# Patient Record
Sex: Male | Born: 1965 | Race: White | Hispanic: No | Marital: Married | Smoking: Never smoker
Health system: Southern US, Community
[De-identification: ages and names within clinical notes are randomized; demographics above are authoritative.]

## PROBLEM LIST (undated history)

## (undated) DIAGNOSIS — J189 Pneumonia, unspecified organism: Secondary | ICD-10-CM

## (undated) DIAGNOSIS — N200 Calculus of kidney: Secondary | ICD-10-CM

## (undated) DIAGNOSIS — M109 Gout, unspecified: Secondary | ICD-10-CM

## (undated) DIAGNOSIS — G510 Bell's palsy: Secondary | ICD-10-CM

## (undated) DIAGNOSIS — I10 Essential (primary) hypertension: Secondary | ICD-10-CM

## (undated) HISTORY — PX: LITHOTRIPSY: SUR834

## (undated) HISTORY — DX: Essential (primary) hypertension: I10

---

## 2001-06-25 ENCOUNTER — Ambulatory Visit (HOSPITAL_COMMUNITY): Admission: RE | Admit: 2001-06-25 | Discharge: 2001-06-25 | Payer: Self-pay | Admitting: Family Medicine

## 2001-06-25 ENCOUNTER — Encounter: Payer: Self-pay | Admitting: Family Medicine

## 2004-06-16 ENCOUNTER — Emergency Department (HOSPITAL_COMMUNITY): Admission: EM | Admit: 2004-06-16 | Discharge: 2004-06-16 | Payer: Self-pay | Admitting: Emergency Medicine

## 2004-06-20 ENCOUNTER — Ambulatory Visit (HOSPITAL_COMMUNITY): Admission: RE | Admit: 2004-06-20 | Discharge: 2004-06-20 | Payer: Self-pay | Admitting: Urology

## 2008-05-08 ENCOUNTER — Emergency Department (HOSPITAL_COMMUNITY): Admission: EM | Admit: 2008-05-08 | Discharge: 2008-05-08 | Payer: Self-pay | Admitting: Emergency Medicine

## 2011-04-01 LAB — URINALYSIS, ROUTINE W REFLEX MICROSCOPIC
Bilirubin Urine: NEGATIVE
Glucose, UA: NEGATIVE
Ketones, ur: NEGATIVE
Leukocytes, UA: NEGATIVE
Urobilinogen, UA: 0.2

## 2011-04-01 LAB — BASIC METABOLIC PANEL
Chloride: 110
Glucose, Bld: 108 — ABNORMAL HIGH
Potassium: 4.1

## 2011-04-01 LAB — URINE MICROSCOPIC-ADD ON

## 2011-04-01 LAB — CBC
HCT: 45.4
Hemoglobin: 15.6
MCHC: 34.3
MCV: 92.8
Platelets: 286
RDW: 12.9
WBC: 7.7

## 2011-04-01 LAB — DIFFERENTIAL
Basophils Relative: 0
Monocytes Absolute: 0.3
Neutro Abs: 6.6

## 2012-12-12 ENCOUNTER — Encounter (HOSPITAL_COMMUNITY): Payer: Self-pay | Admitting: Emergency Medicine

## 2012-12-12 ENCOUNTER — Emergency Department (HOSPITAL_COMMUNITY)
Admission: EM | Admit: 2012-12-12 | Discharge: 2012-12-12 | Disposition: A | Discharge status: Home / Self Care | Payer: 59 | Attending: Emergency Medicine | Admitting: Emergency Medicine

## 2012-12-12 DIAGNOSIS — G510 Bell's palsy: Secondary | ICD-10-CM | POA: Insufficient documentation

## 2012-12-12 DIAGNOSIS — R439 Unspecified disturbances of smell and taste: Secondary | ICD-10-CM | POA: Insufficient documentation

## 2012-12-12 DIAGNOSIS — Z79899 Other long term (current) drug therapy: Secondary | ICD-10-CM | POA: Insufficient documentation

## 2012-12-12 DIAGNOSIS — Z87442 Personal history of urinary calculi: Secondary | ICD-10-CM | POA: Insufficient documentation

## 2012-12-12 DIAGNOSIS — H04219 Epiphora due to excess lacrimation, unspecified lacrimal gland: Secondary | ICD-10-CM | POA: Insufficient documentation

## 2012-12-12 DIAGNOSIS — M109 Gout, unspecified: Secondary | ICD-10-CM | POA: Insufficient documentation

## 2012-12-12 DIAGNOSIS — R209 Unspecified disturbances of skin sensation: Secondary | ICD-10-CM | POA: Insufficient documentation

## 2012-12-12 DIAGNOSIS — Z7982 Long term (current) use of aspirin: Secondary | ICD-10-CM | POA: Insufficient documentation

## 2012-12-12 HISTORY — DX: Calculus of kidney: N20.0

## 2012-12-12 HISTORY — DX: Gout, unspecified: M10.9

## 2012-12-12 LAB — COMPREHENSIVE METABOLIC PANEL
AST: 30 U/L (ref 0–37)
Albumin: 3.5 g/dL (ref 3.5–5.2)
CO2: 26 mEq/L (ref 19–32)
GFR calc non Af Amer: >90 mL/min (ref >90)

## 2012-12-12 LAB — DIFFERENTIAL
Basophils Absolute: 0 10*3/uL (ref 0.0–0.1)
Lymphocytes Relative: 21 % (ref 12–46)
Monocytes Relative: 12 % (ref 3–12)
Neutro Abs: 4.9 10*3/uL (ref 1.7–7.7)
Neutrophils Relative %: 65 % (ref 43–77)

## 2012-12-12 LAB — CBC
HCT: 43.6 % (ref 39.0–52.0)
Hemoglobin: 14.8 g/dL (ref 13.0–17.0)
MCHC: 33.9 g/dL (ref 30.0–36.0)
MCV: 90.5 fL (ref 78.0–100.0)
RDW: 12.4 % (ref 11.5–15.5)
WBC: 7.5 10*3/uL (ref 4.0–10.5)

## 2012-12-12 LAB — POCT I-STAT, CHEM 8
BUN: 15 mg/dL (ref 6–23)
Calcium, Ion: 1.17 mmol/L (ref 1.12–1.23)
Hemoglobin: 15 g/dL (ref 13.0–17.0)
Sodium: 141 mEq/L (ref 135–145)
TCO2: 27 mmol/L (ref 0–100)

## 2012-12-12 LAB — PROTIME-INR: Prothrombin Time: 12.4 seconds (ref 11.6–15.2)

## 2012-12-12 MED ORDER — VALACYCLOVIR HCL 1 G PO TABS: 1000.0000 mg | ORAL_TABLET | Freq: Three times a day (TID) | ORAL | Status: AC

## 2012-12-12 MED ORDER — PREDNISONE 10 MG PO TABS: 20.0000 mg | ORAL_TABLET | Freq: Every day | ORAL | Status: DC

## 2012-12-12 MED ORDER — REFRESH P.M. OP OINT: TOPICAL_OINTMENT | OPHTHALMIC | Status: DC | PRN

## 2012-12-12 NOTE — ED Notes (Signed)
Per pt he went to lay down to take a nap yesterday at 3pm and woke up couple hours later with left sided facial droop and numbness.  Pt grips are equal and strong.

## 2012-12-12 NOTE — ED Provider Notes (Signed)
History     CSN: [id number]  Arrival date & time 12/12/12  1238   First MD Initiated Contact with Patient 12/12/12 1256      Chief Complaint  Patient presents with  . Facial Droop  . Numbness    (Consider location/radiation/quality/duration/timing/severity/associated sxs/prior treatment) The history is provided by the patient.   patient here complaining of left-sided facial droop x24 hours. Also notes trouble with tasting different foods. He denies any headache or vomiting. No neck pain. No chest pain or abdominal pain. No unilateral weakness. Does note some increased tearing to his left eye. Symptoms have been persistent and nothing makes them better or worse and no treatment used prior to arrival.  Past Medical History  Diagnosis Date  . Kidney stones   . Gout     Past Surgical History  Procedure Laterality Date  . Lithotripsy      No family history on file.  History  Substance Use Topics  . Smoking status: Not on file  . Smokeless tobacco: Never Used  . Alcohol Use: No      Review of Systems  All other systems reviewed and are negative.    Allergies  Review of patient's allergies indicates not on file.  Home Medications  No current outpatient prescriptions on file.  BP 134/78  Pulse 92  Temp(Src) 97.6 F (36.4 C) (Oral)  Resp 18  SpO2 97%  Physical Exam  Nursing note and vitals reviewed. Constitutional: He is oriented to person, place, and time. He appears well-developed and well-nourished.  Non-toxic appearance. No distress.  HENT:  Head: Normocephalic and atraumatic.  Eyes: Conjunctivae, EOM and lids are normal. Pupils are equal, round, and reactive to light.  Neck: Normal range of motion. Neck supple. No tracheal deviation present. No mass present.  Cardiovascular: Normal rate, regular rhythm and normal heart sounds.  Exam reveals no gallop.   No murmur heard. Pulmonary/Chest: Effort normal and breath sounds normal. No stridor. No respiratory  distress. He has no decreased breath sounds. He has no wheezes. He has no rhonchi. He has no rales.  Abdominal: Soft. Normal appearance and bowel sounds are normal. He exhibits no distension. There is no tenderness. There is no rebound and no CVA tenderness.  Musculoskeletal: Normal range of motion. He exhibits no edema and no tenderness.  Neurological: He is alert and oriented to person, place, and time. He displays no tremor. A cranial nerve deficit is present. No sensory deficit. Coordination and gait normal. GCS eye subscore is 4. GCS verbal subscore is 5. GCS motor subscore is 6.  Left sided facial paralysis  Skin: Skin is warm and dry. No abrasion and no rash noted.  Psychiatric: He has a normal mood and affect. His speech is normal and behavior is normal.    ED Course  Procedures (including critical care time)  Labs Reviewed  CBC  DIFFERENTIAL  PROTIME-INR  APTT  COMPREHENSIVE METABOLIC PANEL  TROPONIN I  POCT I-STAT, CHEM 8   No results found.   No diagnosis found.    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

## 2015-05-29 ENCOUNTER — Other Ambulatory Visit (HOSPITAL_COMMUNITY): Payer: Self-pay | Admitting: General Surgery

## 2015-05-31 ENCOUNTER — Ambulatory Visit: Payer: Self-pay | Admitting: Dietician

## 2015-06-14 ENCOUNTER — Ambulatory Visit (HOSPITAL_COMMUNITY): Admission: RE | Admit: 2015-06-14 | Payer: Self-pay | Source: Ambulatory Visit

## 2015-06-14 ENCOUNTER — Ambulatory Visit (HOSPITAL_COMMUNITY): Payer: Self-pay

## 2015-06-27 ENCOUNTER — Other Ambulatory Visit (HOSPITAL_COMMUNITY): Payer: Self-pay | Admitting: Psychiatry

## 2015-07-11 ENCOUNTER — Encounter: Payer: 59 | Attending: General Surgery | Admitting: Dietician

## 2015-07-11 ENCOUNTER — Encounter: Payer: Self-pay | Admitting: Dietician

## 2015-07-11 DIAGNOSIS — Z713 Dietary counseling and surveillance: Secondary | ICD-10-CM | POA: Insufficient documentation

## 2015-07-11 DIAGNOSIS — Z6841 Body Mass Index (BMI) 40.0 and over, adult: Secondary | ICD-10-CM | POA: Insufficient documentation

## 2015-07-11 NOTE — Progress Notes (Signed)
  Pre-Op Assessment Visit:  Pre-Operative Sleeve Gastrectomy Surgery  Medical Nutrition Therapy:  Appt start time: 1025   End time:  1100.  Patient was seen on 07/11/2015 for Pre-Operative Nutrition Assessment. Assessment and letter of approval [facility] coordinator on 07/11/2015.   Preferred Learning Style:   No preference indicated   Learning Readiness:   Ready  Handouts given during visit include:  Pre-Op Goals Bariatric Surgery Protein Shakes   During the appointment today the following Pre-Op Goals were reviewed with the patient: Maintain or lose weight as instructed by your surgeon Make healthy food choices Begin to limit portion sizes Limited concentrated sugars and fried foods Keep fat/sugar in the single digits per serving on   food labels Practice CHEWING your food  (aim for 30 chews per bite or until applesauce consistency) Practice not drinking 15 minutes before, during, and 30 minutes after each meal/snack Avoid all carbonated beverages  Avoid/limit caffeinated beverages  Avoid all sugar-sweetened beverages Consume 3 meals per day; eat every 3-5 hours Make a list of non-food related activities Aim for 64-100 ounces of FLUID daily  Aim for at least 60-80 grams of PROTEIN daily Look for a liquid protein source that contain ?15 g protein and ?5 g carbohydrate  (ex: shakes, drinks, shots)  Demonstrated degree of understanding via:  Teach Back  Teaching Method Utilized:  Visual Auditory Hands on  Barriers to learning/adherence to lifestyle change: none  [facility] to [facility] the [facility] to enroll in Pre-Op and Post-Op Nutrition Education when surgery date is scheduled.

## 2015-07-12 ENCOUNTER — Ambulatory Visit (HOSPITAL_COMMUNITY)
Admission: RE | Admit: 2015-07-12 | Discharge: 2015-07-12 | Disposition: A | Discharge status: Home / Self Care | Payer: 59 | Source: Ambulatory Visit | Attending: General Surgery | Admitting: General Surgery

## 2015-07-12 ENCOUNTER — Other Ambulatory Visit: Payer: Self-pay

## 2015-07-12 DIAGNOSIS — R918 Other nonspecific abnormal finding of lung field: Secondary | ICD-10-CM | POA: Diagnosis not present

## 2015-07-12 DIAGNOSIS — Z01818 Encounter for other preprocedural examination: Secondary | ICD-10-CM | POA: Diagnosis not present

## 2015-11-19 ENCOUNTER — Encounter: Payer: 59 | Attending: General Surgery

## 2015-11-19 DIAGNOSIS — Z6841 Body Mass Index (BMI) 40.0 and over, adult: Secondary | ICD-10-CM | POA: Insufficient documentation

## 2015-11-19 DIAGNOSIS — Z713 Dietary counseling and surveillance: Secondary | ICD-10-CM | POA: Diagnosis not present

## 2015-11-19 NOTE — Progress Notes (Signed)
  Pre-Operative Nutrition Class:  Appt start time: 4734   End time:  1830.  Patient was seen on 11/19/2015 for Pre-Operative Bariatric Surgery Education at the [facility].   Surgery date:  Surgery type: sleeve gastrectomy Start weight at [facility]: 348 lbs on 07/11/2015 Weight today: 346.7 lbs  TANITA  BODY COMP RESULTS  11/19/15   BMI (kg/m^2) N/A   Fat Mass (lbs)    Fat Free Mass (lbs)    Total Body Water (lbs)    Samples given per MNT protocol. Patient educated on appropriate usage: Premier protein shake (vanilla - qty 1) Lot #: [id number] Exp: 08/2016  Celebrate Vitamins Multivitamin (orange - qty 1) Lot #: [id number] Exp: 05/2016  Bariatric Advantage Calcium Citrate chew (tropical orange - qty 1) Lot #: [id number] Exp: 01/2016  Unjury Protein Powder (strawberry - qty 1) Lot #: [id number] Exp: 12/2016  The following the learning objectives were met by the patient during this course:  Identify Pre-Op Dietary Goals and will begin 2 weeks pre-operatively  Identify appropriate sources of fluids and proteins   State protein recommendations and appropriate sources pre and post-operatively  Identify Post-Operative Dietary Goals and will follow for 2 weeks post-operatively  Identify appropriate multivitamin and calcium sources  Describe the need for physical activity post-operatively and will follow MD recommendations  State when to call healthcare provider regarding medication questions or post-operative complications  Handouts given during class include:  Pre-Op Bariatric Surgery Diet Handout  Protein Shake Handout  Post-Op Bariatric Surgery Nutrition Handout  [facility] Program Information Flyer  Support Group Information Flyer  [facility] Bariatric Supplements Price List  Follow-Up Plan: Patient will follow-up at [facility] 2 weeks post operatively for diet advancement per MD.

## 2015-11-29 ENCOUNTER — Other Ambulatory Visit: Payer: Self-pay | Admitting: General Surgery

## 2015-11-30 ENCOUNTER — Encounter (HOSPITAL_COMMUNITY): Payer: Self-pay

## 2015-11-30 ENCOUNTER — Encounter (HOSPITAL_COMMUNITY)
Admission: RE | Admit: 2015-11-30 | Discharge: 2015-11-30 | Disposition: A | Discharge status: Home / Self Care | Payer: 59 | Source: Ambulatory Visit | Attending: General Surgery | Admitting: General Surgery

## 2015-11-30 DIAGNOSIS — I10 Essential (primary) hypertension: Secondary | ICD-10-CM | POA: Diagnosis not present

## 2015-11-30 DIAGNOSIS — Z79891 Long term (current) use of opiate analgesic: Secondary | ICD-10-CM | POA: Diagnosis not present

## 2015-11-30 DIAGNOSIS — Z7982 Long term (current) use of aspirin: Secondary | ICD-10-CM | POA: Diagnosis not present

## 2015-11-30 DIAGNOSIS — Z6841 Body Mass Index (BMI) 40.0 and over, adult: Secondary | ICD-10-CM | POA: Diagnosis not present

## 2015-11-30 DIAGNOSIS — Z79899 Other long term (current) drug therapy: Secondary | ICD-10-CM | POA: Diagnosis not present

## 2015-11-30 HISTORY — DX: Bell's palsy: G51.0

## 2015-11-30 HISTORY — DX: Pneumonia, unspecified organism: J18.9

## 2015-11-30 LAB — COMPREHENSIVE METABOLIC PANEL
ALBUMIN: 4.6 g/dL (ref 3.5–5.0)
ALK PHOS: 69 U/L (ref 38–126)
ALT: 68 U/L — AB (ref 17–63)
ANION GAP: 9 (ref 5–15)
AST: 57 U/L — ABNORMAL HIGH (ref 15–41)
BUN: 29 mg/dL — ABNORMAL HIGH (ref 6–20)
CALCIUM: 9.9 mg/dL (ref 8.9–10.3)
CHLORIDE: 108 mmol/L (ref 101–111)
CO2: 22 mmol/L (ref 22–32)
Creatinine, Ser: 0.93 mg/dL (ref 0.61–1.24)
GFR calc Af Amer: >60 mL/min (ref >60)
GFR calc non Af Amer: >60 mL/min (ref >60)
GLUCOSE: 91 mg/dL (ref 65–99)
Potassium: 4.6 mmol/L (ref 3.5–5.1)
SODIUM: 139 mmol/L (ref 135–145)
Total Bilirubin: 1.1 mg/dL (ref 0.3–1.2)
Total Protein: 8.2 g/dL — ABNORMAL HIGH (ref 6.5–8.1)

## 2015-11-30 LAB — CBC WITH DIFFERENTIAL/PLATELET
BASOS PCT: 1 %
Basophils Absolute: 0.1 10*3/uL (ref 0.0–0.1)
EOS ABS: 0.1 10*3/uL (ref 0.0–0.7)
EOS PCT: 2 %
HCT: 43.4 % (ref 39.0–52.0)
HEMOGLOBIN: 15.1 g/dL (ref 13.0–17.0)
Lymphocytes Relative: 28 %
Lymphs Abs: 1.8 10*3/uL (ref 0.7–4.0)
MCH: 32.1 pg (ref 26.0–34.0)
MCHC: 34.8 g/dL (ref 30.0–36.0)
MCV: 92.3 fL (ref 78.0–100.0)
Monocytes Absolute: 0.6 10*3/uL (ref 0.1–1.0)
Monocytes Relative: 9 %
NEUTROS PCT: 60 %
Neutro Abs: 3.9 10*3/uL (ref 1.7–7.7)
PLATELETS: 295 10*3/uL (ref 150–400)
RBC: 4.7 MIL/uL (ref 4.22–5.81)
RDW: 13.1 % (ref 11.5–15.5)
WBC: 6.5 10*3/uL (ref 4.0–10.5)

## 2015-11-30 NOTE — Pre-Procedure Instructions (Addendum)
CXR, EKG 07-12-15, epic  Requested bari bed from [facility]ables for date of surgery.

## 2015-11-30 NOTE — Patient Instructions (Signed)
Frank HellerKyle J Sandoval  11/30/2015   Your procedure is scheduled on: 12/03/15  [facility]  [facility] to 3rd floor to  Short Stay Center at 5:15 AM.  Call this number if you have problems the morning of surgery [phone]   Remember: ONLY 1 PERSON MAY GO WITH YOU TO SHORT STAY TO GET  READY MORNING OF YOUR SURGERY.  Do not eat food or drink liquids :After Midnight.     Take these medicines the morning of surgery with A SIP OF WATER: None                                You may not have any metal on your body including hair pins and              piercings  Do not wear jewelry, make-up, lotions, powders or perfumes, deodorant             Do not wear nail polish.  Do not shave  48 hours prior to surgery.              Men may shave face and neck.   Do not bring valuables to the hospital.  IS NOT             RESPONSIBLE   FOR VALUABLES.  Contacts, dentures or bridgework may not be worn into surgery.  Leave suitcase in the car. After surgery it may be brought to your room.                Please read over the following fact sheets you were given: _____________________________________________________________________             [facility] - Preparing for [facility] Before surgery, you can play an important role.  Because skin is not sterile, your skin needs to be as free of germs as possible.  You can reduce the number of germs on your skin by washing with CHG (chlorahexidine gluconate) soap before surgery.  CHG is an antiseptic cleaner which kills germs and bonds with the skin to continue killing germs even after washing. Please DO NOT use if you have an allergy to CHG or antibacterial soaps.  If your skin becomes reddened/irritated stop using the CHG and inform your nurse when you arrive at Short Stay. Do not shave (including legs and underarms) for at least 48 hours prior to the first CHG shower.  You may shave your  face/neck. Please follow these instructions carefully:  1.  Shower with CHG Soap the night before surgery and the  morning of Surgery.  2.  If you choose to wash your hair, wash your hair first as usual with your  normal  shampoo.  3.  After you shampoo, rinse your hair and body thoroughly to remove the  shampoo.                           4.  Use CHG as you would any other liquid soap.  You can apply chg directly  to the skin and wash                       Gently with a scrungie or clean washcloth.  5.  Apply the CHG Soap to your  body ONLY FROM THE NECK DOWN.   Do not use on face/ open                           Wound or open sores. Avoid contact with eyes, ears mouth and genitals (private parts).                       Wash face,  Genitals (private parts) with your normal soap.             6.  Wash thoroughly, paying special attention to the area where your surgery  will be performed.  7.  Thoroughly rinse your body with warm water from the neck down.  8.  DO NOT shower/wash with your normal soap after using and rinsing off  the CHG Soap.                9.  Pat yourself dry with a clean towel.            10.  Wear clean pajamas.            11.  Place clean sheets on your bed the night of your first shower and do not  sleep with pets. Day of Surgery : Do not apply any lotions/deodorants the morning of surgery.  Please wear clean clothes to the hospital/surgery center.  FAILURE TO FOLLOW THESE INSTRUCTIONS MAY RESULT IN THE CANCELLATION OF YOUR SURGERY PATIENT SIGNATURE_________________________________  NURSE SIGNATURE__________________________________  ________________________________________________________________________

## 2015-12-02 NOTE — H&P (Signed)
History of Present Illness Frank Sandoval T. Jeneva Schweizer MD; 11/29/2015 9:08 AM) Patient words: Pre op.  The patient is a 50 year old male who presents with obesity. He returns prior to planned laparoscopic sleeve gastrectomy for morbid obesity. The patient gives a history of progressive obesity since early adulthood despite multiple attempts at medical management. He has been able to get off 15 or 20 pounds and the weight recurs. Obesity has been affecting the patient in a number of ways including particularly difficulties at his job and activities of daily living. He works as a Biochemist, clinical at [employer]. This requires a lot of climbing steps and fairly strenuous physical activity which is producing increasing fatigue and some shortness of breath at the end of the day. He has some chronic joint discomfort. His parents both died at a relatively early age of weight related illnesses and he is very concerned about his long-term health. He has developed comorbidities of mild hypertension with fluid retention in his lower extremities and hypercholesterolemia.  He has completed his preoperative workup. No concerns of psychological nutrition evaluation. Upper GI series was normal. He has not had any illnesses since his original presentation currently no fever or infections or recent illnesses. He has started his preoperative diet.    Problem List/Past Medical Frank Saa, MD; 11/29/2015 9:10 AM) MORBID OBESITY WITH BMI OF 40.0-44.9, ADULT (E66.01)  Other Problems Frank Saa, MD; 11/29/2015 9:10 AM) Other disease, cancer, significant illness Hypercholesterolemia Kidney Stone High blood pressure  Past Surgical History Frank Saa, MD; 11/29/2015 9:10 AM) No pertinent past surgical history  Diagnostic Studies History Frank Saa, MD; 11/29/2015 9:10 AM) Colonoscopy 1-5 years ago  Allergies Frank Sandoval, Frank Sandoval; 11/29/2015 8:55 AM) No Known Drug  Allergies11/17/2016  Medication History Frank Saa, MD; 11/29/2015 9:10 AM) Furosemide (  Tablet, Oral) Active. Hydrocodone-Acetaminophen (5-325MG  Tablet, Oral) Active. Uloric (  Tablet, Oral) Active. Aspirin (  Tablet, Oral) Active. Medications Reconciled OxyCODONE HCl ( /5ML Solution, 5-10 Milliliter Oral every four hours, as needed, Taken starting 11/29/2015) Active. Protonix (  Tablet DR, 1 (one) Tablet Oral daily, Taken starting 11/29/2015) Active. Zofran ODT (  Tablet Disperse, 1 (one) Tablet Oral every six hours, as needed, Taken starting 11/29/2015) Active.  Social History Frank Saa, MD; 11/29/2015 9:10 AM) Alcohol use Remotely quit alcohol use. Caffeine use Carbonated beverages, Coffee, Tea. No drug use Tobacco use Never smoker.  Family History Frank Saa, MD; 11/29/2015 9:10 AM) Alcohol Abuse Father. Arthritis Father, Mother. Migraine Headache Mother. Colon Polyps Father. Hypertension Mother.  Vitals Frank Sandoval Frank Sandoval; 11/29/2015 8:56 AM) 11/29/2015 8:55 AM Weight: 331 lb Height: 75in Body Surface Area: 2.72 m Body Mass Index: 41.37 kg/m  Temp.: 97.58F  Pulse: 81 (Regular)  BP: 130/90 (Sitting, Left Arm, Standard)       Physical Exam Frank Sandoval T. [name] MD; 11/29/2015 9:08 AM) The physical exam findings are as follows: Note:General: Alert, obese Caucasian male, in no distress Skin: Warm and dry without rash or infection. HEENT: No palpable masses or thyromegaly. Sclera nonicteric. Lymph nodes: No cervical, supraclavicular, or inguinal nodes palpable. Lungs: Breath sounds clear and equal. No wheezing or increased work of breathing. Cardiovascular: Regular rate and rhythm without murmer. No JVD or edema. Peripheral pulses intact. No carotid bruits. Abdomen: Nondistended. Soft and nontender. No masses palpable. No organomegaly. No palpable hernias. Extremities: No edema or joint swelling or  deformity. No chronic venous stasis changes. Neurologic: Alert and fully oriented. Gait normal. No focal weakness. Psychiatric: Normal mood  and affect. Thought content appropriate with normal judgement and insight    Assessment & Plan Frank SandovalFrank Pease T. Bartholomew Ramesh MD; 11/29/2015 9:09 AM) MORBID OBESITY WITH BMI OF 40.0-44.9, ADULT (E66.01) Impression: Patient with progressive morbid obesity unresponsive to multiple efforts at medical management who presents with a BMI of 42.7 and comorbidities of hypertension, lower extremity edema and elevated cholesterol. I believe there would be very significant medical benefit from surgical weight loss. Ready to proceed with planned laparoscopic sleeve gastrectomy. We again reviewed the consent form and all his questions were answered. He is given prescriptions for pain and nausea medication and Protonix. He is doing well with his preoperative diet.  Laparoscopic sleeve gastrectomy

## 2015-12-02 NOTE — Anesthesia Preprocedure Evaluation (Addendum)
Anesthesia Evaluation  Patient identified by MRN, date of birth, ID band Patient awake    Reviewed: Allergy & Precautions, NPO status , Patient's Chart, lab work & pertinent test results  Airway Mallampati: II  TM Distance: >3 FB Neck ROM: Full    Dental  (+) Dental Advisory Given   Pulmonary neg pulmonary ROS,    breath sounds clear to auscultation       Cardiovascular hypertension, Pt. on medications  Rhythm:Regular Rate:Normal     Neuro/Psych  Neuromuscular disease    GI/Hepatic negative GI ROS, Neg liver ROS,   Endo/Other  negative endocrine ROS  Renal/GU negative Renal ROS     Musculoskeletal   Abdominal   Peds  Hematology negative hematology ROS (+)   Anesthesia Other Findings   Reproductive/Obstetrics                            Lab Results  Component Value Date   WBC 6.5 11/30/2015   HGB 15.1 11/30/2015   HCT 43.4 11/30/2015   MCV 92.3 11/30/2015   PLT 295 11/30/2015   Lab Results  Component Value Date   CREATININE 0.93 11/30/2015   BUN 29* 11/30/2015   NA 139 11/30/2015   K 4.6 11/30/2015   CL 108 11/30/2015   CO2 22 11/30/2015    Anesthesia Physical Anesthesia Plan  ASA: III  Anesthesia Plan: General   Post-op Pain Management:    Induction: Intravenous  Airway Management Planned: Oral ETT  Additional Equipment:   Intra-op Plan:   Post-operative Plan: Extubation in OR  Informed Consent:   Plan Discussed with:   Anesthesia Plan Comments:         Anesthesia Quick Evaluation

## 2015-12-03 ENCOUNTER — Inpatient Hospital Stay (HOSPITAL_COMMUNITY): Payer: 59 | Admitting: Anesthesiology

## 2015-12-03 ENCOUNTER — Encounter (HOSPITAL_COMMUNITY): Payer: Self-pay | Admitting: *Deleted

## 2015-12-03 ENCOUNTER — Encounter (HOSPITAL_COMMUNITY)
Admission: RE | Disposition: A | Discharge status: Home / Self Care | Payer: Self-pay | Source: Ambulatory Visit | Attending: Surgery

## 2015-12-03 ENCOUNTER — Observation Stay (HOSPITAL_COMMUNITY)
Admission: RE | Admit: 2015-12-03 | Discharge: 2015-12-04 | Disposition: A | Discharge status: Home / Self Care | Payer: 59 | Source: Ambulatory Visit | Attending: General Surgery | Admitting: General Surgery

## 2015-12-03 DIAGNOSIS — Z79899 Other long term (current) drug therapy: Secondary | ICD-10-CM | POA: Insufficient documentation

## 2015-12-03 DIAGNOSIS — Z79891 Long term (current) use of opiate analgesic: Secondary | ICD-10-CM | POA: Insufficient documentation

## 2015-12-03 DIAGNOSIS — I10 Essential (primary) hypertension: Secondary | ICD-10-CM | POA: Insufficient documentation

## 2015-12-03 DIAGNOSIS — Z6841 Body Mass Index (BMI) 40.0 and over, adult: Secondary | ICD-10-CM | POA: Insufficient documentation

## 2015-12-03 DIAGNOSIS — Z7982 Long term (current) use of aspirin: Secondary | ICD-10-CM | POA: Insufficient documentation

## 2015-12-03 HISTORY — PX: LAPAROSCOPIC GASTRIC SLEEVE RESECTION: SHX5895

## 2015-12-03 LAB — HEMOGLOBIN AND HEMATOCRIT, BLOOD
HEMATOCRIT: 44 % (ref 39.0–52.0)
HEMOGLOBIN: 14.9 g/dL (ref 13.0–17.0)

## 2015-12-03 SURGERY — GASTRECTOMY, SLEEVE, LAPAROSCOPIC
Anesthesia: General | Site: Abdomen

## 2015-12-03 MED ORDER — SUCCINYLCHOLINE CHLORIDE 20 MG/ML IJ SOLN
INTRAMUSCULAR | Status: DC | PRN
  Administered 2015-12-03: 140 mg via INTRAVENOUS

## 2015-12-03 MED ORDER — ACETAMINOPHEN 160 MG/5ML PO SOLN: 325.0000 mg | ORAL | Status: DC | PRN

## 2015-12-03 MED ORDER — SUGAMMADEX SODIUM 200 MG/2ML IV SOLN
INTRAVENOUS | Status: DC | PRN
  Administered 2015-12-03: 200 mg via INTRAVENOUS

## 2015-12-03 MED ORDER — ACETAMINOPHEN 10 MG/ML IV SOLN
1000.0000 mg | Freq: Four times a day (QID) | INTRAVENOUS | Status: AC
  Administered 2015-12-03 – 2015-12-04 (×4): 1000 mg via INTRAVENOUS
  Filled 2015-12-03 (×5): qty 100

## 2015-12-03 MED ORDER — DEXAMETHASONE SODIUM PHOSPHATE 10 MG/ML IJ SOLN
INTRAMUSCULAR | Status: AC
  Filled 2015-12-03: qty 1

## 2015-12-03 MED ORDER — HYDROMORPHONE HCL 1 MG/ML IJ SOLN
0.2500 mg | INTRAMUSCULAR | Status: DC | PRN
  Administered 2015-12-03 (×2): 0.5 mg via INTRAVENOUS

## 2015-12-03 MED ORDER — HYDROMORPHONE HCL 1 MG/ML IJ SOLN
INTRAMUSCULAR | Status: AC
  Filled 2015-12-03: qty 1

## 2015-12-03 MED ORDER — ONDANSETRON 4 MG PO TBDP: 8.0000 mg | ORAL_TABLET | Freq: Three times a day (TID) | ORAL | Status: DC | PRN

## 2015-12-03 MED ORDER — PROPOFOL 10 MG/ML IV BOLUS
INTRAVENOUS | Status: DC | PRN
  Administered 2015-12-03: 50 mg via INTRAVENOUS
  Administered 2015-12-03: 200 mg via INTRAVENOUS

## 2015-12-03 MED ORDER — ACETAMINOPHEN 10 MG/ML IV SOLN
1000.0000 mg | Freq: Once | INTRAVENOUS | Status: AC
  Administered 2015-12-03: 1000 mg via INTRAVENOUS

## 2015-12-03 MED ORDER — ENOXAPARIN SODIUM 30 MG/0.3ML ~~LOC~~ SOLN
30.0000 mg | Freq: Two times a day (BID) | SUBCUTANEOUS | Status: DC
  Administered 2015-12-03 – 2015-12-04 (×2): 30 mg via SUBCUTANEOUS
  Filled 2015-12-03 (×4): qty 0.3

## 2015-12-03 MED ORDER — SUGAMMADEX SODIUM 200 MG/2ML IV SOLN
INTRAVENOUS | Status: AC
  Filled 2015-12-03: qty 2

## 2015-12-03 MED ORDER — FAMOTIDINE IN NACL 20-0.9 MG/50ML-% IV SOLN
20.0000 mg | Freq: Two times a day (BID) | INTRAVENOUS | Status: DC
  Administered 2015-12-03 – 2015-12-04 (×3): 20 mg via INTRAVENOUS
  Filled 2015-12-03 (×4): qty 50

## 2015-12-03 MED ORDER — EVICEL 5 ML EX KIT
PACK | Freq: Once | CUTANEOUS | Status: AC
  Administered 2015-12-03: 5 mL
  Filled 2015-12-03: qty 1

## 2015-12-03 MED ORDER — ONDANSETRON HCL 4 MG/2ML IJ SOLN
INTRAMUSCULAR | Status: DC | PRN
  Administered 2015-12-03: 4 mg via INTRAVENOUS

## 2015-12-03 MED ORDER — PREMIER PROTEIN SHAKE: 2.0000 [oz_av] | Freq: Four times a day (QID) | ORAL | Status: DC

## 2015-12-03 MED ORDER — BUPIVACAINE-EPINEPHRINE 0.25% -1:200000 IJ SOLN
INTRAMUSCULAR | Status: DC | PRN
  Administered 2015-12-03: 25 mL

## 2015-12-03 MED ORDER — PROPOFOL 10 MG/ML IV BOLUS
INTRAVENOUS | Status: AC
  Filled 2015-12-03: qty 20

## 2015-12-03 MED ORDER — KETOROLAC TROMETHAMINE 30 MG/ML IJ SOLN
INTRAMUSCULAR | Status: AC
  Filled 2015-12-03: qty 1

## 2015-12-03 MED ORDER — HYDROMORPHONE HCL 1 MG/ML IJ SOLN
0.2500 mg | INTRAMUSCULAR | Status: DC | PRN
  Administered 2015-12-03 (×4): 0.5 mg via INTRAVENOUS

## 2015-12-03 MED ORDER — LACTATED RINGERS IV SOLN: INTRAVENOUS | Status: DC

## 2015-12-03 MED ORDER — FENTANYL CITRATE (PF) 250 MCG/5ML IJ SOLN
INTRAMUSCULAR | Status: DC | PRN
  Administered 2015-12-03: 50 ug via INTRAVENOUS
  Administered 2015-12-03: 100 ug via INTRAVENOUS
  Administered 2015-12-03 (×2): 50 ug via INTRAVENOUS

## 2015-12-03 MED ORDER — ROCURONIUM BROMIDE 100 MG/10ML IV SOLN
INTRAVENOUS | Status: AC
  Filled 2015-12-03: qty 1

## 2015-12-03 MED ORDER — ACETAMINOPHEN 10 MG/ML IV SOLN
INTRAVENOUS | Status: AC
Start: 2015-12-03 — End: 2015-12-03
  Filled 2015-12-03: qty 100

## 2015-12-03 MED ORDER — MIDAZOLAM HCL 2 MG/2ML IJ SOLN
INTRAMUSCULAR | Status: AC
  Filled 2015-12-03: qty 2

## 2015-12-03 MED ORDER — KCL IN DEXTROSE-NACL 20-5-0.9 MEQ/L-%-% IV SOLN
INTRAVENOUS | Status: DC
Start: 2015-12-03 — End: 2015-12-04
  Administered 2015-12-03 – 2015-12-04 (×4): via INTRAVENOUS
  Filled 2015-12-03 (×6): qty 1000

## 2015-12-03 MED ORDER — DEXTROSE 5 % IV SOLN
INTRAVENOUS | Status: AC
  Filled 2015-12-03: qty 2

## 2015-12-03 MED ORDER — MORPHINE SULFATE (PF) 2 MG/ML IV SOLN
2.0000 mg | INTRAVENOUS | Status: DC | PRN
  Administered 2015-12-03: 2 mg via INTRAVENOUS
  Filled 2015-12-03: qty 1

## 2015-12-03 MED ORDER — ONDANSETRON HCL 4 MG/2ML IJ SOLN
4.0000 mg | INTRAMUSCULAR | Status: DC | PRN
  Administered 2015-12-03: 4 mg via INTRAVENOUS
  Filled 2015-12-03: qty 2

## 2015-12-03 MED ORDER — MIDAZOLAM HCL 5 MG/5ML IJ SOLN
INTRAMUSCULAR | Status: DC | PRN
  Administered 2015-12-03 (×2): 1 mg via INTRAVENOUS

## 2015-12-03 MED ORDER — DEXTROSE 5 % IV SOLN
2.0000 g | INTRAVENOUS | Status: AC
  Administered 2015-12-03: 2 g via INTRAVENOUS

## 2015-12-03 MED ORDER — KETOROLAC TROMETHAMINE 30 MG/ML IJ SOLN
30.0000 mg | Freq: Once | INTRAMUSCULAR | Status: AC | PRN
  Administered 2015-12-03: 30 mg via INTRAVENOUS

## 2015-12-03 MED ORDER — SODIUM CHLORIDE 0.9 % IJ SOLN
INTRAMUSCULAR | Status: DC | PRN
  Administered 2015-12-03: 50 mL

## 2015-12-03 MED ORDER — LACTATED RINGERS IR SOLN
Status: DC | PRN
  Administered 2015-12-03: 1000 mL

## 2015-12-03 MED ORDER — STERILE WATER FOR IRRIGATION IR SOLN
Status: DC | PRN
  Administered 2015-12-03: 1000 mL

## 2015-12-03 MED ORDER — LIDOCAINE HCL (CARDIAC) 20 MG/ML IV SOLN
INTRAVENOUS | Status: DC | PRN
  Administered 2015-12-03: 80 mg via INTRATRACHEAL

## 2015-12-03 MED ORDER — CETYLPYRIDINIUM CHLORIDE 0.05 % MT LIQD
7.0000 mL | Freq: Two times a day (BID) | OROMUCOSAL | Status: DC
  Administered 2015-12-03 – 2015-12-04 (×2): 7 mL via OROMUCOSAL

## 2015-12-03 MED ORDER — ACETAMINOPHEN 160 MG/5ML PO SOLN: 650.0000 mg | ORAL | Status: DC | PRN

## 2015-12-03 MED ORDER — 0.9 % SODIUM CHLORIDE (POUR BTL) OPTIME
TOPICAL | Status: DC | PRN
  Administered 2015-12-03: 1000 mL

## 2015-12-03 MED ORDER — OXYCODONE HCL 5 MG/5ML PO SOLN: 5.0000 mg | ORAL | Status: DC | PRN

## 2015-12-03 MED ORDER — SODIUM CHLORIDE 0.9 % IJ SOLN
INTRAMUSCULAR | Status: AC
  Filled 2015-12-03: qty 50

## 2015-12-03 MED ORDER — FENTANYL CITRATE (PF) 250 MCG/5ML IJ SOLN
INTRAMUSCULAR | Status: AC
  Filled 2015-12-03: qty 5

## 2015-12-03 MED ORDER — ONDANSETRON HCL 4 MG/2ML IJ SOLN
INTRAMUSCULAR | Status: AC
  Filled 2015-12-03: qty 2

## 2015-12-03 MED ORDER — HEPARIN SODIUM (PORCINE) 5000 UNIT/ML IJ SOLN
5000.0000 [IU] | INTRAMUSCULAR | Status: AC
  Administered 2015-12-03: 5000 [IU] via SUBCUTANEOUS
  Filled 2015-12-03: qty 1

## 2015-12-03 MED ORDER — BUPIVACAINE LIPOSOME 1.3 % IJ SUSP
20.0000 mL | Freq: Once | INTRAMUSCULAR | Status: AC
  Administered 2015-12-03: 20 mL
  Filled 2015-12-03: qty 20

## 2015-12-03 MED ORDER — BUPIVACAINE-EPINEPHRINE (PF) 0.25% -1:200000 IJ SOLN
INTRAMUSCULAR | Status: AC
  Filled 2015-12-03: qty 30

## 2015-12-03 MED ORDER — PROMETHAZINE HCL 25 MG/ML IJ SOLN: 6.2500 mg | INTRAMUSCULAR | Status: DC | PRN

## 2015-12-03 MED ORDER — LIDOCAINE HCL (CARDIAC) 20 MG/ML IV SOLN
INTRAVENOUS | Status: AC
  Filled 2015-12-03: qty 5

## 2015-12-03 MED ORDER — CHLORHEXIDINE GLUCONATE 0.12 % MT SOLN
15.0000 mL | Freq: Two times a day (BID) | OROMUCOSAL | Status: DC
  Administered 2015-12-03 – 2015-12-04 (×3): 15 mL via OROMUCOSAL
  Filled 2015-12-03 (×5): qty 15

## 2015-12-03 MED ORDER — LACTATED RINGERS IV SOLN
INTRAVENOUS | Status: DC | PRN
  Administered 2015-12-03 (×2): via INTRAVENOUS

## 2015-12-03 MED ORDER — DEXAMETHASONE SODIUM PHOSPHATE 10 MG/ML IJ SOLN
INTRAMUSCULAR | Status: DC | PRN
  Administered 2015-12-03: 10 mg via INTRAVENOUS

## 2015-12-03 MED ORDER — ROCURONIUM BROMIDE 100 MG/10ML IV SOLN
INTRAVENOUS | Status: DC | PRN
  Administered 2015-12-03: 20 mg via INTRAVENOUS
  Administered 2015-12-03 (×2): 10 mg via INTRAVENOUS
  Administered 2015-12-03: 30 mg via INTRAVENOUS

## 2015-12-03 MED ORDER — PHENYLEPHRINE HCL 10 MG/ML IJ SOLN
INTRAMUSCULAR | Status: DC | PRN
  Administered 2015-12-03: 80 ug via INTRAVENOUS

## 2015-12-03 SURGICAL SUPPLY — 61 items
APPLICATOR COTTON TIP 6IN STRL (MISCELLANEOUS) IMPLANT
APPLIER CLIP ROT 10 11.4 M/L (STAPLE)
APPLIER CLIP ROT 13.4 12 LRG (CLIP)
APR CLP LRG 13.4X12 ROT 20 MLT (CLIP)
APR CLP MED LRG 11.4X10 (STAPLE)
BAG SPEC RTRVL LRG 6X4 10 (ENDOMECHANICALS)
BLADE SURG SZ11 CARB STEEL (BLADE) ×2 IMPLANT
CABLE HIGH FREQUENCY MONO STRZ (ELECTRODE) ×2 IMPLANT
CHLORAPREP W/TINT 26ML (MISCELLANEOUS) ×3 IMPLANT
CLIP APPLIE ROT 10 11.4 M/L (STAPLE) IMPLANT
CLIP APPLIE ROT 13.4 12 LRG (CLIP) IMPLANT
COVER SURGICAL LIGHT HANDLE (MISCELLANEOUS) ×2 IMPLANT
DEVICE PMI PUNCTURE CLOSURE (MISCELLANEOUS) ×2 IMPLANT
DEVICE SUT QUICK LOAD TK 5 (STAPLE) IMPLANT
DEVICE SUT TI-KNOT TK 5X26 (MISCELLANEOUS) IMPLANT
DEVICE SUTURE ENDOST 10MM (ENDOMECHANICALS) IMPLANT
DRAPE UTILITY XL STRL (DRAPES) ×4 IMPLANT
ELECT REM PT RETURN 9FT ADLT (ELECTROSURGICAL) ×2
ELECTRODE REM PT RTRN 9FT ADLT (ELECTROSURGICAL) ×1 IMPLANT
GAUZE SPONGE 4X4 12PLY STRL (GAUZE/BANDAGES/DRESSINGS) IMPLANT
GLOVE BIOGEL PI IND STRL 7.5 (GLOVE) ×1 IMPLANT
GLOVE BIOGEL PI INDICATOR 7.5 (GLOVE) ×1
GLOVE ECLIPSE 7.5 STRL STRAW (GLOVE) ×2 IMPLANT
GOWN STRL REUS W/TWL XL LVL3 (GOWN DISPOSABLE) ×8 IMPLANT
HOVERMATT SINGLE USE (MISCELLANEOUS) ×2 IMPLANT
KIT BASIN OR (CUSTOM PROCEDURE TRAY) ×2 IMPLANT
LIQUID BAND (GAUZE/BANDAGES/DRESSINGS) ×2 IMPLANT
MARKER SKIN DUAL TIP RULER LAB (MISCELLANEOUS) ×2 IMPLANT
NEEDLE SPNL 22GX3.5 QUINCKE BK (NEEDLE) ×2 IMPLANT
PACK UNIVERSAL I (CUSTOM PROCEDURE TRAY) ×2 IMPLANT
POUCH SPECIMEN RETRIEVAL 10MM (ENDOMECHANICALS) IMPLANT
RELOAD STAPLE 60 3.6 BLU REG (STAPLE) ×1 IMPLANT
RELOAD STAPLE 60 4.1 GRN THCK (STAPLE) ×1 IMPLANT
RELOAD STAPLER BLUE 60MM (STAPLE) ×1 IMPLANT
RELOAD STAPLER GOLD 60MM (STAPLE) ×2 IMPLANT
RELOAD STAPLER GREEN 60MM (STAPLE) ×3 IMPLANT
SCISSORS LAP 5X45 EPIX DISP (ENDOMECHANICALS) ×2 IMPLANT
SET IRRIG TUBING LAPAROSCOPIC (IRRIGATION / IRRIGATOR) ×2 IMPLANT
SHEARS HARMONIC ACE PLUS 45CM (MISCELLANEOUS) ×2 IMPLANT
SLEEVE ADV FIXATION 5X100MM (TROCAR) ×2 IMPLANT
SLEEVE GASTRECTOMY 36FR VISIGI (MISCELLANEOUS) ×2 IMPLANT
SOLUTION ANTI FOG 6CC (MISCELLANEOUS) ×2 IMPLANT
SPONGE LAP 18X18 X RAY DECT (DISPOSABLE) ×2 IMPLANT
STAPLER ECHELON LONG 60 440 (INSTRUMENTS) ×2 IMPLANT
STAPLER RELOAD BLUE 60MM (STAPLE) ×2
STAPLER RELOAD GOLD 60MM (STAPLE) ×4
STAPLER RELOAD GREEN 60MM (STAPLE) ×6
SUT DEVICE BRAIDED 0X39 (SUTURE) IMPLANT
SUT MNCRL AB 4-0 PS2 18 (SUTURE) ×2 IMPLANT
SUT VICRYL 0 TIES 12 18 (SUTURE) ×2 IMPLANT
SYR 10ML ECCENTRIC (SYRINGE) ×2 IMPLANT
SYR 20CC LL (SYRINGE) ×2 IMPLANT
TIP RIGID 35CM EVICEL (HEMOSTASIS) ×2 IMPLANT
TOWEL OR 17X26 10 PK STRL BLUE (TOWEL DISPOSABLE) ×2 IMPLANT
TOWEL OR NON WOVEN STRL DISP B (DISPOSABLE) ×2 IMPLANT
TROCAR ADV FIXATION 5X100MM (TROCAR) ×2 IMPLANT
TROCAR BLADELESS 15MM (ENDOMECHANICALS) ×2 IMPLANT
TROCAR BLADELESS OPT 5 100 (ENDOMECHANICALS) ×2 IMPLANT
TUBING CONNECTING 10 (TUBING) ×2 IMPLANT
TUBING ENDO SMARTCAP PENTAX (MISCELLANEOUS) ×2 IMPLANT
TUBING INSUF HEATED (TUBING) ×2 IMPLANT

## 2015-12-03 NOTE — Transfer of Care (Signed)
Immediate Anesthesia Transfer of Care Note  Patient: Frank HellerKyle J Teem  Procedure(s) Performed: Procedure(s): LAPAROSCOPIC GASTRIC SLEEVE RESECTION, UPPER ENDOSCOPY (N/A)  Patient Location: PACU  Anesthesia Type:General  Level of Consciousness: awake, alert  and patient cooperative  Airway & Oxygen Therapy: Patient Spontanous Breathing and Patient connected to face mask oxygen  Post-op Assessment: Report given to RN and Post -op Vital signs reviewed and stable  Post vital signs: Reviewed and stable  Last Vitals:  Filed Vitals:   12/03/15 0523  BP: 137/90  Pulse: 79  Temp: 36.6 C  Resp: 20    Last Pain: There were no vitals filed for this visit.       Complications: No apparent anesthesia complications

## 2015-12-03 NOTE — Anesthesia Postprocedure Evaluation (Signed)
Anesthesia Post Note  Patient: Frank Sandoval  Procedure(s) Performed: Procedure(s) (LRB): LAPAROSCOPIC GASTRIC SLEEVE RESECTION, UPPER ENDOSCOPY (N/A)  Patient location during evaluation: PACU Anesthesia Type: General Level of consciousness: awake and alert Pain management: pain level controlled Vital Signs Assessment: post-procedure vital signs reviewed and stable Respiratory status: spontaneous breathing, nonlabored ventilation, respiratory function stable and patient connected to nasal cannula oxygen Cardiovascular status: blood pressure returned to baseline and stable Postop Assessment: no signs of nausea or vomiting Anesthetic complications: no    Last Vitals:  Filed Vitals:   12/03/15 1330 12/03/15 1433  BP: 132/85 136/80  Pulse: 70 78  Temp: 36.4 C 36.4 C  Resp: 14 14    Last Pain:  Filed Vitals:   12/03/15 1434  PainSc: 3                  Kennieth RadFitzgerald, Frank Sandoval

## 2015-12-03 NOTE — Interval H&P Note (Signed)
History and Physical Interval Note:  12/03/2015 7:02 AM  Frank Sandoval  has presented today for surgery, with the diagnosis of Morbid Obesity  The various methods of treatment have been discussed with the patient and family. After consideration of risks, benefits and other options for treatment, the patient has consented to  Procedure(s): LAPAROSCOPIC GASTRIC SLEEVE RESECTION, UPPER ENDOSCOPY (N/A) as a surgical intervention .  The patient's history has been reviewed, patient examined, no change in status, stable for surgery.  I have reviewed the patient's chart and labs.  Questions were answered to the patient's satisfaction.     [name] T

## 2015-12-03 NOTE — Op Note (Signed)
Preoperative Diagnosis: Morbid Obesity  Postoprative Diagnosis: Morbid Obesity  Procedure: Procedure(s): LAPAROSCOPIC GASTRIC SLEEVE RESECTION, UPPER ENDOSCOPY   Surgeon: Glenna Fellows T   Assistants: Ovidio Kin  Anesthesia:  General endotracheal anesthesia  Indications: patient is a 50 year old male who presents with progressive morbid obesity unresponsive to medical management at a BMI of 41 and comorbidities of chronic joint pain. After extensive preoperative workup and discussion detailed elsewhere we have elected to proceed with laparoscopic sleeve gastrectomy as surgical treatment for his morbid obesity.    Procedure Detail:  Patient was brought to the operating room, placed in the supine position on the operating table, and general endotracheal anesthesia induced.He had received preoperative IV antibiotics. PAS Were in place. He received preoperative subcutaneous heparin.The abdomen was widely sterilely prepped and draped. Patient timeout was performed and correct procedure verified. Access was obtained with a 5 mm Optiview trocar in the left upper quadrant and pneumoperitoneum established. No evidence of trocar injury. Under direct vision a 5 mm trocar was placed in the right upper quadrant, a 15 mm trocar at the base of the falciform ligament and a 5 mm trocar above and to the left of the umbilicus for the camera port. Through a 5 mm subxiphoid site the [facility] retractor was placed in the left lobe of the liver elevated with excellent exposure of the stomach and hiatus. Finally a 5 mm trocar was placed in the left upper quadrant. The patient was placed in steep reverse Trendelenburg.Preoperative upper GI series was normal without hiatal hernia the patient has no significant reflux.We did not test for hiatal hernia. Beginning at the mid greater curve vasculature was dissected with the Harmonic scalpel and the lesser sac entered without difficulty. The dissection progressed  proximally along the greater curve dividing short gastric vessels with the Harmonic scalpel. The fundus was completely dissected and mobilized away from the spleen andposterior attachments along theleft crus were dissected and the left crus completely exposed and dissected along its length. The esophageal fat pad was mobilized. The hiatus appeared normal without evidence of hernia. We then dissected distally along the greater curve with the Harmonic scalpel to a point measured 5 cm from the pylorus. Some posterior nonvascular attachments were sharply taken down until the stomach was completely mobilized to its lesser curve vasculature. The 25 French VisiG tube was passed orally without difficulty and its tip positioned at the pylorus. It was positioned along the lesser curve of the stomach splayed out symmetrically and then placed on continuous suction with good fixation of the stomach. The gastrectomy was begun with an initial firing of the green load 60 mm echelon stapler beginning 5 cm from the pylorus and angling up toward but staying well away from the incisura. A second firing of the green load stapler was used to carry the staple line passed the incisura allowing a little extra room adjacent to the tube at this point. The sleeve was then continued with one further firing of the 60 mm green load stapler adjacent to the tube followed by 2 firings of the 60 mm gold load stapler and a final firing of the blue load stapler carrying the final staple line just lateral to the esophageal fat pad completing thesleeve gastrectomy. It appeared very symmetrical withouttwisting or narrowing. The sleeve was insufflated using the VisiG under saline irrigation and there was no evidence of leak. The VisiG tube was removed. Dr. Ezzard Standing performed upper endoscopy showing a nice symmetrical sleeve with no narrowingor bleeding. One area ofexternal  bleeding along the proximal staple line was controlled with clips. The staple line was  coated with Evicel. The gastrectomy specimen was extracted through the 15 mm trocar site after dilating it slightly. This trocar site was closed under direct vision with 0 Vicryl. Following this a bilateral T AP block was performed under direct vision using Exparel diluted to 80 mL with some used to infiltrate each trocar site as well. The Nathanson retractor was removed under direct vision. All CO2 was evacuated and trochars removed. Skin incisions were closed with subcuticular Monocryl andLiquiban. Sponge needle and instrument counts were correct.    Findings: As above  Estimated Blood Loss:  Minimal         Drains: none  Blood Given: none          Specimens: sleeve gastrectomy        Complications:  * No complications entered in OR log *         Disposition: PACU - hemodynamically stable.         Condition: stable

## 2015-12-03 NOTE — Op Note (Signed)
Name:  Frank HellerKyle J Stepanian MRN: [id number] Date of Surgery: 12/03/2015  Preop Diagnosis:  Morbid Obesity  Postop Diagnosis:  Morbid Obesity (Weight - 331, BMI - 41.4) , S/P Gastric Sleeve  Procedure:  Upper endoscopy  (Intraoperative)  Surgeon:  Ovidio Kinavid Arlon Bleier, M.D.  Anesthesia:  GET  Indications for procedure: Frank Sandoval is a 50 y.o. male whose primary care physician is Johny BlamerHARRIS, WILLIAM, MD and has completed a Gastric Sleeve today by Dr. Johna SheriffHoxworth.  I am doing an intraoperative upper endoscopy to evaluate the gastric pouch.  Operative Note: The patient is under general anesthesia.  Dr. Johna SheriffHoxworth is laparoscoping the patient while I do an upper endoscopy to evaluate the stomach pouch.  With the patient intubated, I passed the Pentax upper endoscope without difficulty down the esophagus.  The esophago-gastric junction was at 40 cm.    The mucosa of the stomach looked viable and the staple line was intact without bleeding.  I advanced to the pylorus, but did not go through it.  While I insufflated the stomach pouch with air, Dr. Johna SheriffHoxworth  flooded the upper abdomen with saline to put the gastric pouch under saline.  There was no bubbling or evidence of a leak.  There was no evidence of narrowing of the pouch and the gastric sleeve looked tubular.  The scope was then withdrawn.  The esophagus was unremarkable and the patient tolerated the endoscopy without difficulty.  Ovidio Kinavid Mykal Batiz, MD, [facility] Pager: [phone] Office phone:  [phone]

## 2015-12-03 NOTE — Anesthesia Procedure Notes (Signed)
Procedure Name: Intubation Date/Time: 12/03/2015 8:17 AM Performed by: Delphia GratesHANDLER, Chung Chagoya Pre-anesthesia Checklist: Emergency Drugs available, Suction available, Patient being monitored and Patient identified Patient Re-evaluated:Patient Re-evaluated prior to inductionOxygen Delivery Method: Circle system utilized Preoxygenation: Pre-oxygenation with 100% oxygen Intubation Type: IV induction Laryngoscope Size: Mac and 4 Grade View: Grade II Tube type: Oral Tube size: 7.5 mm Number of attempts: 1 Airway Equipment and Method: Stylet Placement Confirmation: ETT inserted through vocal cords under direct vision and positive ETCO2 Secured at: 22 cm Tube secured with: Tape Dental Injury: Teeth and Oropharynx as per pre-operative assessment

## 2015-12-04 DIAGNOSIS — Z6841 Body Mass Index (BMI) 40.0 and over, adult: Secondary | ICD-10-CM

## 2015-12-04 LAB — CBC WITH DIFFERENTIAL/PLATELET
BASOS ABS: 0 10*3/uL (ref 0.0–0.1)
Basophils Relative: 0 %
EOS PCT: 0 %
Eosinophils Absolute: 0 10*3/uL (ref 0.0–0.7)
HCT: 40.3 % (ref 39.0–52.0)
HEMOGLOBIN: 13.5 g/dL (ref 13.0–17.0)
LYMPHS ABS: 1.2 10*3/uL (ref 0.7–4.0)
LYMPHS PCT: 12 %
MCH: 31.5 pg (ref 26.0–34.0)
MCHC: 33.5 g/dL (ref 30.0–36.0)
MCV: 94.2 fL (ref 78.0–100.0)
Monocytes Absolute: 0.9 10*3/uL (ref 0.1–1.0)
Monocytes Relative: 8 %
NEUTROS ABS: 8.2 10*3/uL — AB (ref 1.7–7.7)
NEUTROS PCT: 80 %
PLATELETS: 260 10*3/uL (ref 150–400)
RBC: 4.28 MIL/uL (ref 4.22–5.81)
RDW: 13.6 % (ref 11.5–15.5)
WBC: 10.2 10*3/uL (ref 4.0–10.5)

## 2015-12-04 LAB — HEMOGLOBIN AND HEMATOCRIT, BLOOD
HCT: 38.3 % — ABNORMAL LOW (ref 39.0–52.0)
HEMOGLOBIN: 12.9 g/dL — AB (ref 13.0–17.0)

## 2015-12-04 NOTE — Care Management Note (Signed)
Case Management Note  Patient Details  Name: Luvenia HellerKyle J Dagley MRN: [id number] Date of Birth: 27-Sep-1965  Subjective/Objective:                  LAPAROSCOPIC GASTRIC SLEEVE RESECTION, UPPER ENDOSCOPY (N/A) Action/Plan: Discharge planning Expected Discharge Date:  12/05/15              Expected Discharge Plan:  Home/Self Care  In-House Referral:     Discharge planning Services  CM Consult  Post Acute Care Choice:  NA Choice offered to:  NA  DME Arranged:  N/A DME Agency:  NA  HH Arranged:    HH Agency:  NA  Status of Service:  Completed, signed off  Medicare Important Message Given:    Date Medicare IM Given:    Medicare IM give by:    Date Additional Medicare IM Given:    Additional Medicare Important Message give by:     If discussed at Long Length of Stay Meetings, dates discussed:    Additional Comments: CM reviewed and no home health needs anticipated.  No CM needs were communicated. Yves DillJeffries, Ruthell Feigenbaum Christine, RN 12/04/2015, 11:18 AM

## 2015-12-04 NOTE — Progress Notes (Signed)
Patient ID: Frank HellerKyle J Sandoval, male   DOB: May 26, 1966, 50 y.o.   MRN: [id number] 1 Day Post-Op  Subjective: No C/O this AM x small blister on foot from walking in the halls!  Mild pain last night, none now, denies nausea. I had ordered ice and water po yest but has received none  Objective: Vital signs in last 24 hours: Temp:  [97.5 F (36.4 C)-98.2 F (36.8 C)] 98.2 F (36.8 C) (06/06 0935) Pulse Rate:  [69-98] 73 (06/06 0935) Resp:  [11-21] 16 (06/06 0935) BP: (104-153)/(56-94) 132/74 mmHg (06/06 0935) SpO2:  [91 %-100 %] 100 % (06/06 0935) Last BM Date: 12/04/15  Intake/Output from previous day: 06/05 0701 - 06/06 0700 In: 3812.5 [I.V.:3512.5; IV Piggyback:300] Out: 1900 [Urine:1850; Blood:50] Intake/Output this shift: Total I/O In: 360 [P.O.:360] Out: -   General appearance: alert, cooperative and no distress GI: normal findings: soft, non-tender Incision/Wound: Clean and dry  Lab Results:   Recent Labs  12/03/15 1812 12/04/15 0420  WBC  --  10.2  HGB 14.9 13.5  HCT 44.0 40.3  PLT  --  260   BMET No results for input(s): NA, K, CL, CO2, GLUCOSE, BUN, CREATININE, CALCIUM in the last 72 hours.   Studies/Results: No results found.  Anti-infectives: Anti-infectives    Start     Dose/Rate Route Frequency Ordered Stop   12/03/15 0508  cefOXitin (MEFOXIN) 2 g in dextrose 5 % 50 mL IVPB     2 g 100 mL/hr over 30 Minutes Intravenous On call to O.R. 12/03/15 [id number] 12/03/15 0829      Assessment/Plan: s/p Procedure(s): LAPAROSCOPIC GASTRIC SLEEVE RESECTION, UPPER ENDOSCOPY Doing well post op.  Start protein shakes this AM.  Likely home later today   LOS: 1 day    [name] T 12/04/2015

## 2015-12-04 NOTE — Progress Notes (Signed)
Discharge instructions discussed with patient, verbalized understanding and agreement. 

## 2015-12-04 NOTE — Progress Notes (Signed)
Patient alert and oriented, pain is controlled. Patient is tolerating fluids, advanced to protein shake today, patient is tolerating well.  Reviewed Gastric sleeve discharge instructions with patient and patient is able to articulate understanding.  Provided information on BELT program, Support Group and [facility] outpatient pharmacy. All questions answered, will continue to monitor.  

## 2015-12-04 NOTE — Discharge Instructions (Signed)

## 2015-12-04 NOTE — Discharge Summary (Signed)
  Patient ID: Frank HellerKyle J Sandoval [id number] 50 y.o. 1966/01/30  12/03/2015  Discharge date and time: 12/04/2015   Admitting Physician: Glenna FellowsHOXWORTH,Joon Pohle T  Discharge Physician: Glenna FellowsHOXWORTH,Devonta Blanford T  Admission Diagnoses: Morbid Obesity  Discharge Diagnoses: same  Operations: Procedure(s): LAPAROSCOPIC GASTRIC SLEEVE RESECTION, UPPER ENDOSCOPY  Admission Condition: good  Discharged Condition: good  Indication for Admission: patient is a 50 year old male with progressive morbid obesity unresponsive to medical management. He presents with a BMI of 41 and chronic joint pain.  After extensive preoperative workup and discussion detailed elsewhere he is electively admitted for laparoscopic sleeve gastrectomy.  Hospital Course: On the morning of admission he underwent an uneventful laparoscopic sleeve gastrectomy.  His postoperative course was uncomplicated. He had mild pain  The first postoperative night. Vital signs remained stable.CBC was unremarkable. He was started on liquids on the first postoperative day which she tolerated very well without nausea or limitation. On the afternoon of the first postoperative days abdomen is nontender, wounds healing well and he is felt ready for discharge.  Disposition: Home  Patient Instructions:    Medication List    TAKE these medications        acetaminophen 325 MG tablet  Commonly known as:  TYLENOL  Take 650 mg by mouth every 6 (six) hours as needed (For pain.).     aspirin EC 81 MG tablet  Take 81 mg by mouth daily.  Notes to Patient:  Avoid NSAIDs for 6-8 weeks after surgery      cyclobenzaprine 10 MG tablet  Commonly known as:  FLEXERIL  Take 10 mg by mouth every 8 (eight) hours as needed for muscle spasms.     furosemide 40 MG tablet  Commonly known as:  LASIX  Take 40 mg by mouth daily as needed for fluid or edema.  Notes to Patient:  Monitor Blood Pressure Daily and keep a log for primary care physician.  Monitor for symptoms of  dehydration.  You may need to make changes to your medications with rapid weight loss.        HYDROcodone-acetaminophen 5-325 MG tablet  Commonly known as:  NORCO/VICODIN  Take 1 tablet by mouth every 6 (six) hours as needed (For pain.).  Notes to Patient:  Patient takes as needed, please do not take with pain prescription prescribed by surgeon.     magnesium oxide 400 MG tablet  Commonly known as:  MAG-OX  Take 800 mg by mouth daily.     multivitamin with minerals Tabs tablet  Take 1 tablet by mouth daily.     ondansetron 8 MG disintegrating tablet  Commonly known as:  ZOFRAN-ODT  Take 8 mg by mouth every 8 (eight) hours as needed for nausea or vomiting.     pantoprazole 40 MG tablet  Commonly known as:  PROTONIX  Take 40 mg by mouth daily.     ULORIC 80 MG Tabs  Generic drug:  Febuxostat  Take 80 mg by mouth daily.        Activity: no heavy lifting for 3 weeks Diet: bariatric protein shakes Wound Care: none needed  Follow-up:  With Dr. Johna SheriffHoxworth in 3 weeks.  Signed: Mariella SaaBenjamin T Talley Casco MD, FACS  12/04/2015, 9:31 PM

## 2015-12-04 NOTE — Plan of Care (Signed)
Problem: Food- and Nutrition-Related Knowledge Deficit (NB-1.1) Goal: Nutrition education Formal process to instruct or train a patient/client in a skill or to impart knowledge to help patients/clients voluntarily manage or modify food choices and eating behavior to maintain or improve health. Outcome: Completed/Met Date Met:  12/04/15 Nutrition Education Note  Received consult for diet education per DROP protocol.   Discussed 2 week post op diet with pt. Emphasized that liquids must be non carbonated, non caffeinated, and sugar free. Fluid goals discussed. Reviewed progression of diet to include soft proteins at 7-10 days post-op. Pt to follow up with outpatient bariatric RD for further diet progression after 2 weeks. Multivitamins and minerals also reviewed. Teach back method used, pt expressed understanding, expect good compliance.   Diet: First 2 Weeks  You will see the dietitian about two (2) weeks after your surgery. The dietitian will increase the types of foods you can eat if you are handling liquids well:  If you have severe vomiting or nausea and cannot handle clear liquids lasting longer than 1 day, call your surgeon  Protein Shake  Drink at least 2 ounces of shake 5-6 times per day  Each serving of protein shakes (usually 8 - 12 ounces) should have a minimum of:  15 grams of protein  And no more than 5 grams of carbohydrate  Goal for protein each day:  Men = 80 grams per day  Women = 60 grams per day  Protein powder may be added to fluids such as non-fat milk or Lactaid milk or Soy milk (limit to 35 grams added protein powder per serving)   Hydration  Slowly increase the amount of water and other clear liquids as tolerated (See Acceptable Fluids)  Slowly increase the amount of protein shake as tolerated  Sip fluids slowly and throughout the day  May use sugar substitutes in small amounts (no more than 6 - 8 packets per day; i.e. Splenda)   Fluid Goal  The first goal is to  drink at least 8 ounces of protein shake/drink per day (or as directed by the nutritionist); some examples of protein shakes are Syntrax Nectar, Adkins Advantage, EAS Edge HP, and Unjury. See handout from pre-op Bariatric Education Class:  Slowly increase the amount of protein shake you drink as tolerated  You may find it easier to slowly sip shakes throughout the day  It is important to get your proteins in first  Your fluid goal is to drink 64 - 100 ounces of fluid daily  It may take a few weeks to build up to this  32 oz (or more) should be clear liquids  And  32 oz (or more) should be full liquids (see below for examples)  Liquids should not contain sugar, caffeine, or carbonation   Clear Liquids:  Water or Sugar-free flavored water (i.e. Fruit H2O, Propel)  Decaffeinated coffee or tea (sugar-free)  Crystal Lite, Wyler's Lite, Minute Maid Lite  Sugar-free Jell-O  Bouillon or broth  Sugar-free Popsicle: *Less than 20 calories each; Limit 1 per day   Full Liquids:  Protein Shakes/Drinks + 2 choices per day of other full liquids  Full liquids must be:  No More Than 12 grams of Carbs per serving  No More Than 3 grams of Fat per serving  Strained low-fat cream soup  Non-Fat milk  Fat-free Lactaid Milk  Sugar-free yogurt (Dannon Lite & Fit, Greek yogurt)     Zoei Amison, MS, RD, LDN Pager: [phone] After Hours Pager: [phone]        

## 2015-12-18 ENCOUNTER — Encounter: Payer: 59 | Attending: General Surgery

## 2015-12-19 NOTE — Progress Notes (Signed)
Bariatric Class:  Appt start time: 1530 end time:  1630.  2 Week Post-Operative Nutrition Class  Patient was seen on 12/18/2015 for Post-Operative Nutrition education at [facility].   Surgery date: 12/03/2015 Surgery type: sleeve gastrectomy Start weight at [facility]: 348 lbs on 07/11/2015 Weight today: 309.7 lbs Weight change: 37 lbs  The following the learning objectives were met by the patient during this course:  Identifies Phase 3A (Soft, High Proteins) Dietary Goals and will begin from 2 weeks post-operatively to 2 months post-operatively  Identifies appropriate sources of fluids and proteins   States protein recommendations and appropriate sources post-operatively  Identifies the need for appropriate texture modifications, mastication, and bite sizes when consuming solids  Identifies appropriate multivitamin and calcium sources post-operatively  Describes the need for physical activity post-operatively and will follow MD recommendations  States when to call healthcare provider regarding medication questions or post-operative complications  Handouts given during class include:  Phase 3A: Soft, High Protein Diet Handout  Band Fill Guidelines Handout  Follow-Up Plan: Patient will follow-up at [facility] in 4 weeks for 6 week post-op nutrition visit for diet advancement per MD.

## 2017-03-29 IMAGING — RF DG UGI W/ KUB
8 series · 10 of 14 positions shown · non-contrast
Comparison: CT of the abdomen and pelvis 05/08/2008

CLINICAL DATA: Morbid obesity.  Preop for gastric sleeve surgery.

EXAM:
UPPER GI SERIES WITH KUB
TECHNIQUE: After obtaining a scout radiograph a routine upper GI series was
performed using thin barium
FLUOROSCOPY TIME:  Radiation Exposure Index (as provided by the
fluoroscopic device): 77.3 mGy*cm2

[Series 1: t abdomen supine · 0.15mm/px · 1 of 1 slices shown]
[im 1/1]
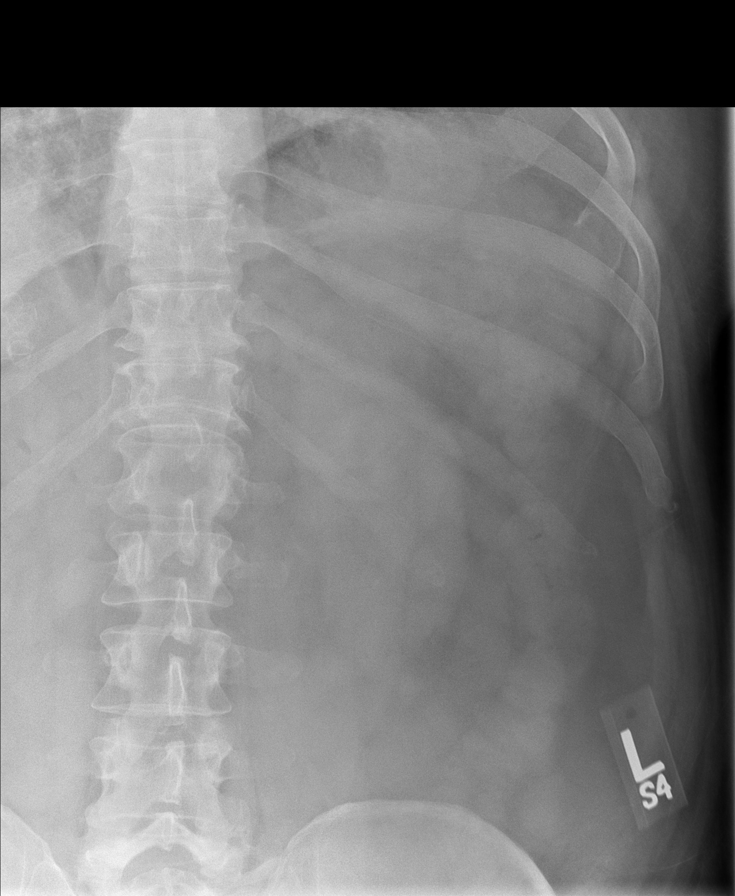

[Series 2: cp_standard · 0.52mm/px · 3 of 153 frames shown (1 of 4)]
[frame 77/153]
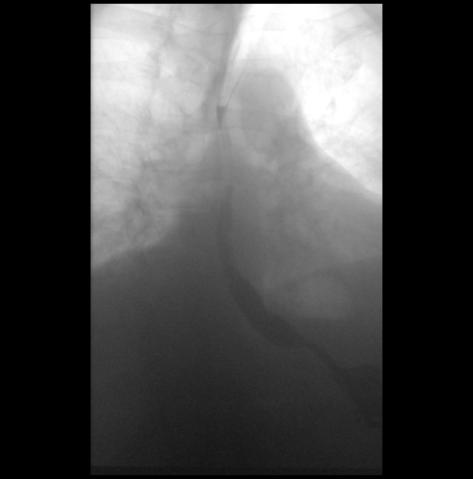
[frame 131/153]
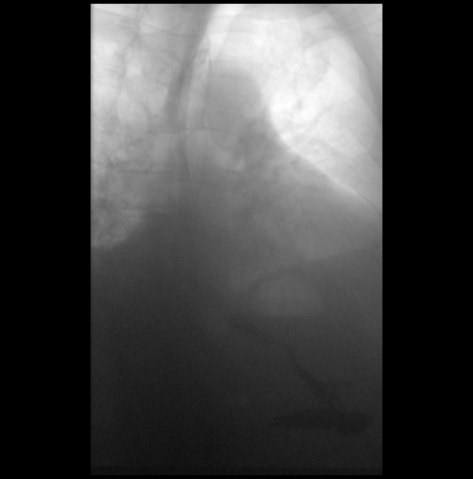
[frame 153/153]
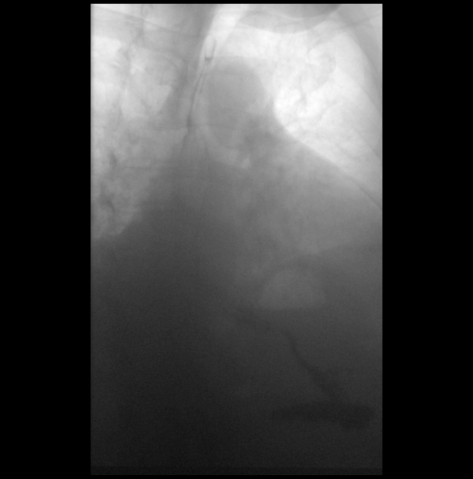

[Series 3: cp_standard · 0.52mm/px · 1 of 165 frames shown (2 of 4)]
[frame 129/165]
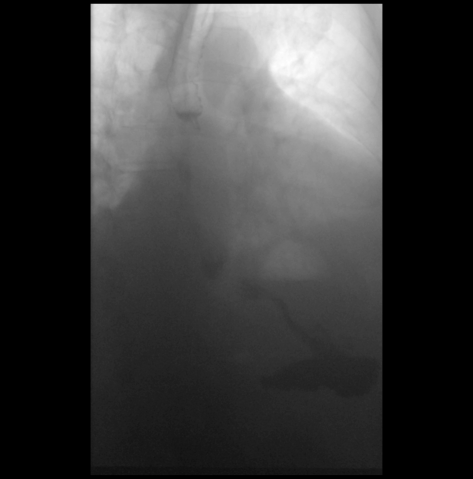

[Series 4: fluoro_barium singleshot_bw · 0.17mm/px · 1 of 1 slices shown (1 of 3)]
[im 1/1]
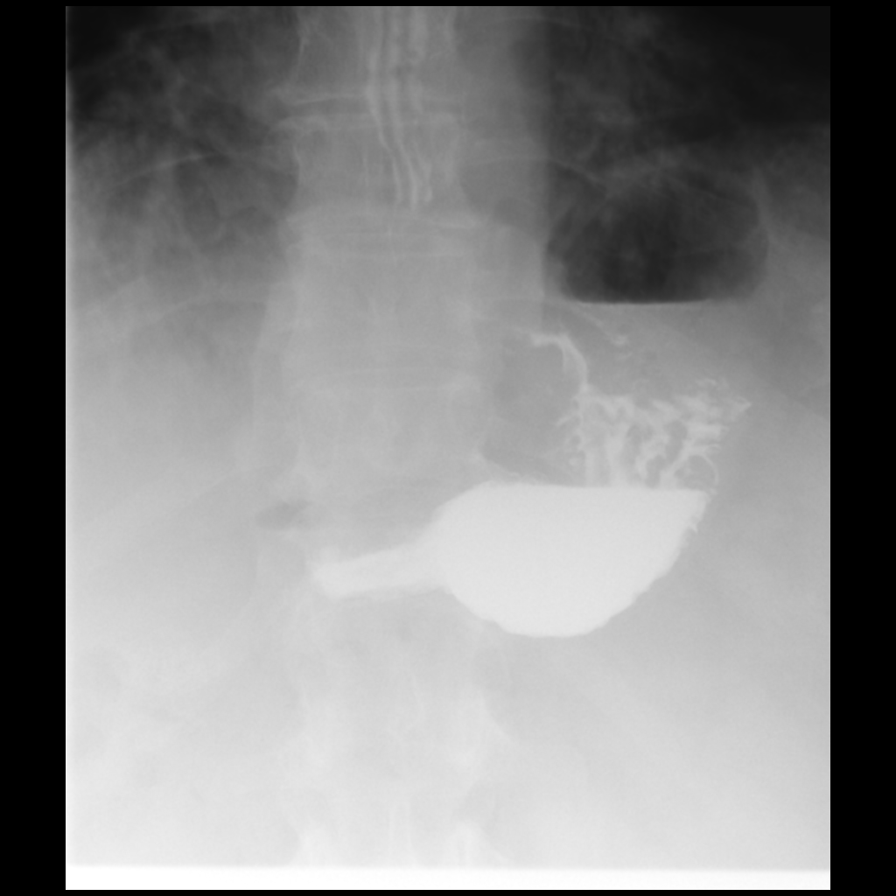

[Series 5: fluoro_barium singleshot_bw · 0.17mm/px · 1 of 1 slices shown (2 of 3)]
[im 1/1]
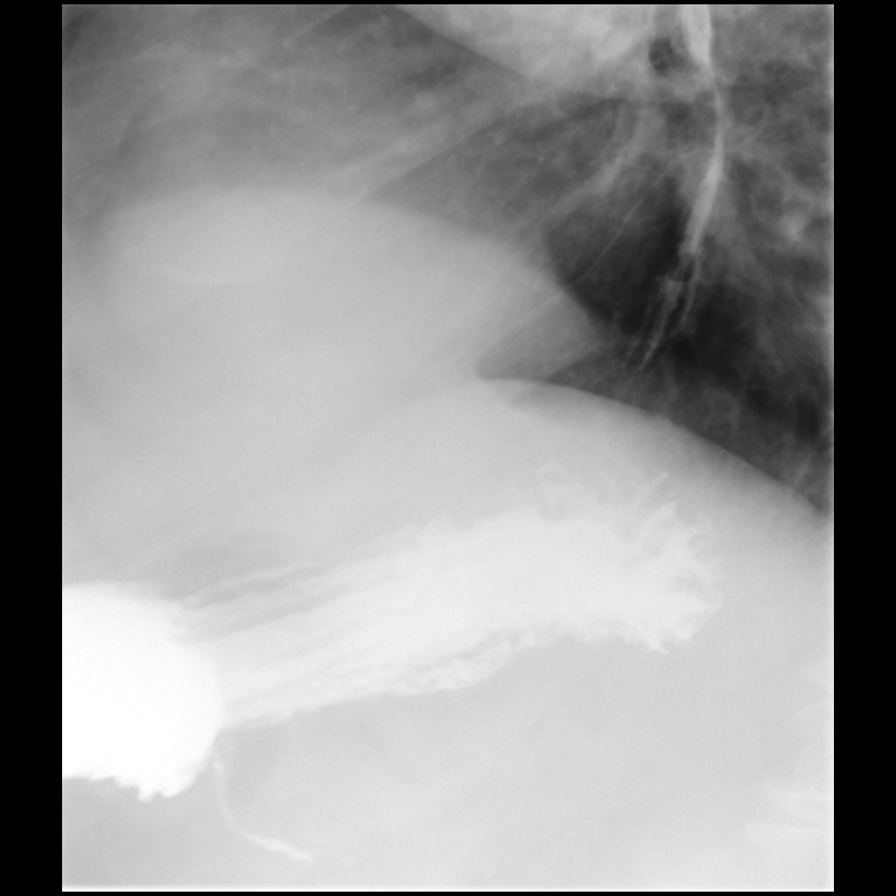

[Series 6: fluoro_barium singleshot_bw · 0.17mm/px · 1 of 1 slices shown (3 of 3)]
[im 1/1]
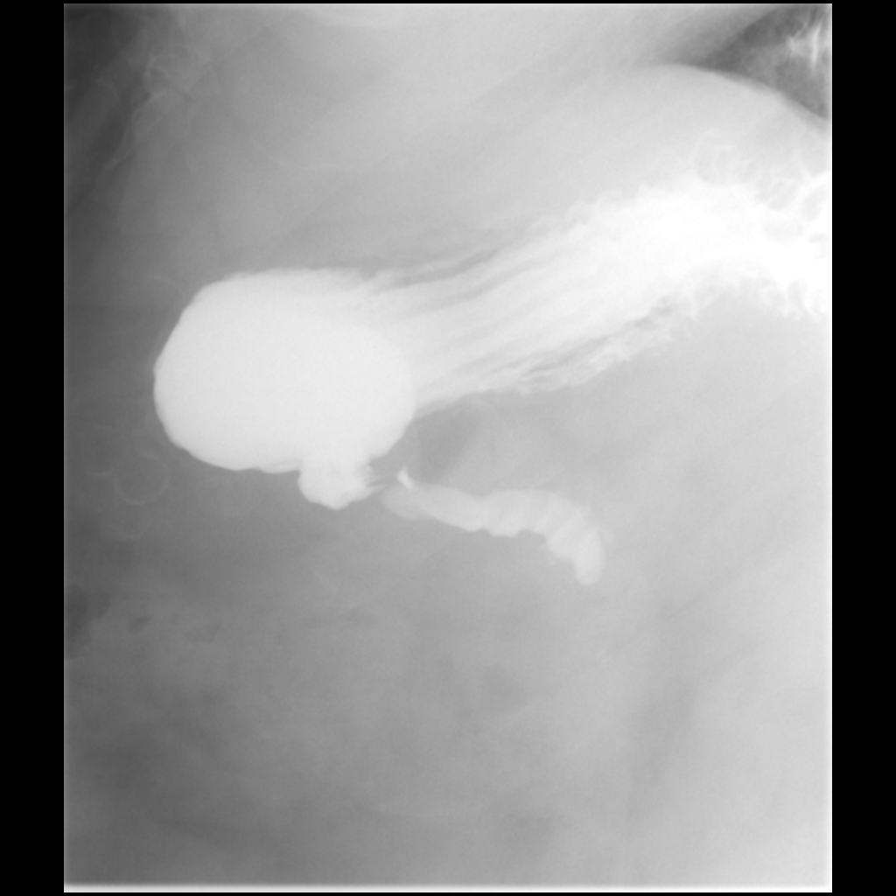

[Series 7: cp_standard · 0.17mm/px · 1 of 1 slices shown (3 of 4)]
[im 1/1]
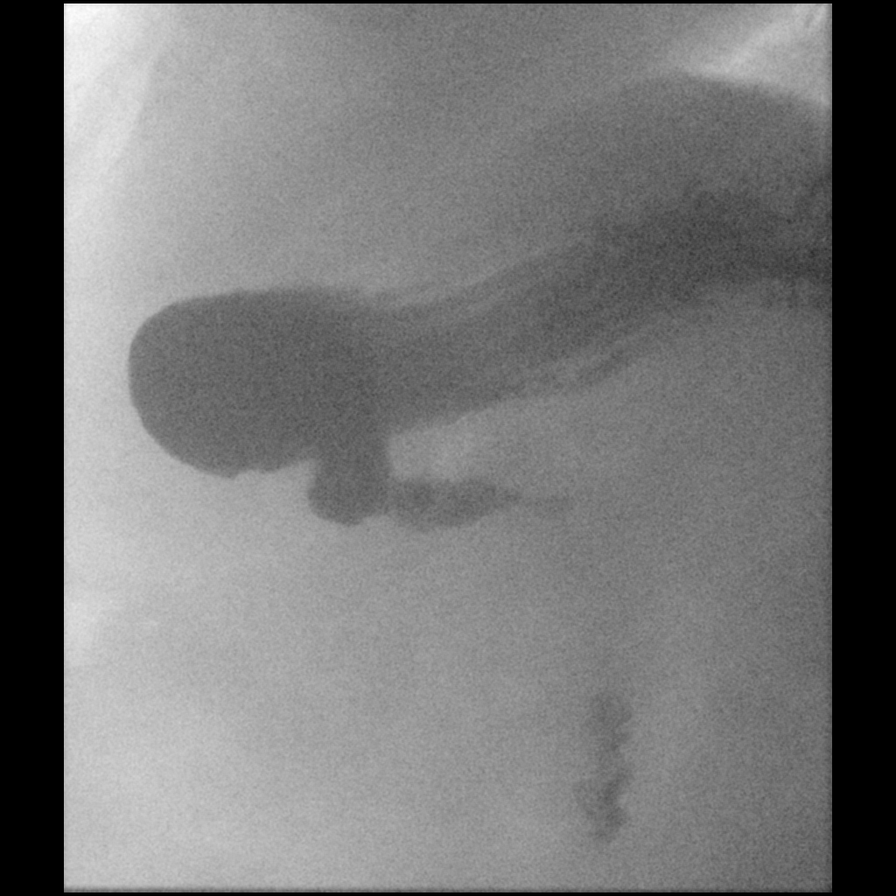

[Series 8: cp_standard · 0.17mm/px · 1 of 1 slices shown (4 of 4)]
[im 1/1]
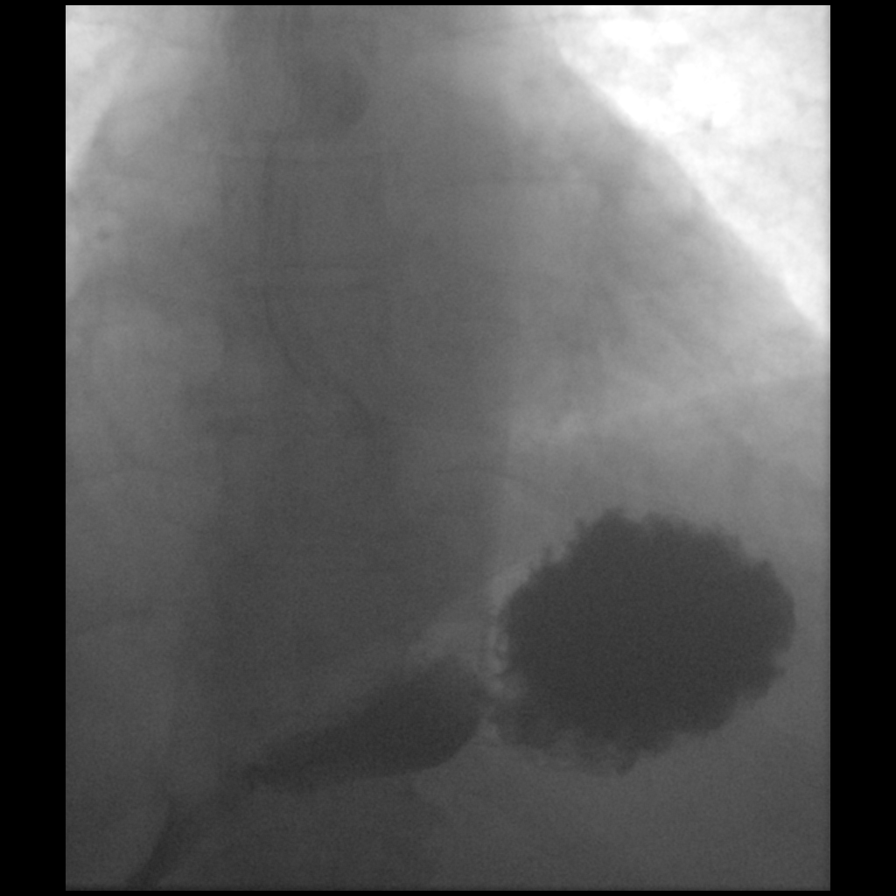

[10 of 14 positions shown; findings below may reference images not displayed]

FINDINGS: The bowel gas pattern is normal.  Lung bases are clear.

Esophageal motility is within normal limits. There is no significant
hiatal hernia. Stomach is unremarkable. Gastric rotation is within
normal limits. There is no significant reflux.
IMPRESSION: Unremarkable upper GI study.

## 2017-07-08 ENCOUNTER — Encounter (HOSPITAL_COMMUNITY): Payer: Self-pay

## 2017-12-25 DIAGNOSIS — Z0000 Encounter for general adult medical examination without abnormal findings: Secondary | ICD-10-CM | POA: Diagnosis not present

## 2018-02-24 DIAGNOSIS — J209 Acute bronchitis, unspecified: Secondary | ICD-10-CM | POA: Diagnosis not present

## 2018-04-16 DIAGNOSIS — Z23 Encounter for immunization: Secondary | ICD-10-CM | POA: Diagnosis not present

## 2018-05-14 DIAGNOSIS — H16223 Keratoconjunctivitis sicca, not specified as Sjogren's, bilateral: Secondary | ICD-10-CM | POA: Diagnosis not present

## 2018-07-23 DIAGNOSIS — Z125 Encounter for screening for malignant neoplasm of prostate: Secondary | ICD-10-CM | POA: Diagnosis not present

## 2018-07-23 DIAGNOSIS — Z903 Acquired absence of stomach [part of]: Secondary | ICD-10-CM | POA: Diagnosis not present

## 2018-07-23 DIAGNOSIS — G894 Chronic pain syndrome: Secondary | ICD-10-CM | POA: Diagnosis not present

## 2018-07-23 DIAGNOSIS — E7800 Pure hypercholesterolemia, unspecified: Secondary | ICD-10-CM | POA: Diagnosis not present

## 2018-09-02 DIAGNOSIS — J209 Acute bronchitis, unspecified: Secondary | ICD-10-CM | POA: Diagnosis not present

## 2018-10-06 DIAGNOSIS — R05 Cough: Secondary | ICD-10-CM | POA: Diagnosis not present

## 2022-01-10 DIAGNOSIS — G894 Chronic pain syndrome: Secondary | ICD-10-CM | POA: Diagnosis not present

## 2022-01-10 DIAGNOSIS — E7800 Pure hypercholesterolemia, unspecified: Secondary | ICD-10-CM | POA: Diagnosis not present

## 2022-01-10 DIAGNOSIS — M109 Gout, unspecified: Secondary | ICD-10-CM | POA: Diagnosis not present

## 2022-02-14 DIAGNOSIS — E7800 Pure hypercholesterolemia, unspecified: Secondary | ICD-10-CM | POA: Diagnosis not present

## 2022-02-14 DIAGNOSIS — Z0000 Encounter for general adult medical examination without abnormal findings: Secondary | ICD-10-CM | POA: Diagnosis not present

## 2022-08-22 DIAGNOSIS — G894 Chronic pain syndrome: Secondary | ICD-10-CM | POA: Diagnosis not present

## 2022-08-22 DIAGNOSIS — M109 Gout, unspecified: Secondary | ICD-10-CM | POA: Diagnosis not present

## 2022-08-22 DIAGNOSIS — E7800 Pure hypercholesterolemia, unspecified: Secondary | ICD-10-CM | POA: Diagnosis not present

## 2023-02-27 DIAGNOSIS — E7800 Pure hypercholesterolemia, unspecified: Secondary | ICD-10-CM | POA: Diagnosis not present

## 2023-02-27 DIAGNOSIS — Z125 Encounter for screening for malignant neoplasm of prostate: Secondary | ICD-10-CM | POA: Diagnosis not present

## 2023-02-27 DIAGNOSIS — Z0000 Encounter for general adult medical examination without abnormal findings: Secondary | ICD-10-CM | POA: Diagnosis not present

## 2023-02-27 DIAGNOSIS — R030 Elevated blood-pressure reading, without diagnosis of hypertension: Secondary | ICD-10-CM | POA: Diagnosis not present

## 2023-09-22 DIAGNOSIS — J209 Acute bronchitis, unspecified: Secondary | ICD-10-CM | POA: Diagnosis not present

## 2024-03-15 ENCOUNTER — Other Ambulatory Visit (HOSPITAL_COMMUNITY): Payer: Self-pay | Admitting: Family Medicine

## 2024-03-15 DIAGNOSIS — Z903 Acquired absence of stomach [part of]: Secondary | ICD-10-CM | POA: Diagnosis not present

## 2024-03-15 DIAGNOSIS — E7800 Pure hypercholesterolemia, unspecified: Secondary | ICD-10-CM | POA: Diagnosis not present

## 2024-03-15 DIAGNOSIS — R030 Elevated blood-pressure reading, without diagnosis of hypertension: Secondary | ICD-10-CM | POA: Diagnosis not present

## 2024-03-15 DIAGNOSIS — Z23 Encounter for immunization: Secondary | ICD-10-CM | POA: Diagnosis not present

## 2024-03-15 DIAGNOSIS — Z125 Encounter for screening for malignant neoplasm of prostate: Secondary | ICD-10-CM | POA: Diagnosis not present

## 2024-03-15 DIAGNOSIS — Z0000 Encounter for general adult medical examination without abnormal findings: Secondary | ICD-10-CM | POA: Diagnosis not present

## 2024-03-15 DIAGNOSIS — G894 Chronic pain syndrome: Secondary | ICD-10-CM | POA: Diagnosis not present

## 2024-03-15 DIAGNOSIS — Z136 Encounter for screening for cardiovascular disorders: Secondary | ICD-10-CM

## 2024-04-01 ENCOUNTER — Ambulatory Visit (HOSPITAL_BASED_OUTPATIENT_CLINIC_OR_DEPARTMENT_OTHER)
Admission: RE | Admit: 2024-04-01 | Discharge: 2024-04-01 | Disposition: A | Discharge status: Home / Self Care | Payer: Self-pay | Source: Ambulatory Visit | Attending: Family Medicine | Admitting: Family Medicine

## 2024-04-01 DIAGNOSIS — L821 Other seborrheic keratosis: Secondary | ICD-10-CM | POA: Diagnosis not present

## 2024-04-01 DIAGNOSIS — Z136 Encounter for screening for cardiovascular disorders: Secondary | ICD-10-CM | POA: Insufficient documentation

## 2024-04-01 DIAGNOSIS — C4441 Basal cell carcinoma of skin of scalp and neck: Secondary | ICD-10-CM | POA: Diagnosis not present

## 2024-04-01 DIAGNOSIS — D225 Melanocytic nevi of trunk: Secondary | ICD-10-CM | POA: Diagnosis not present

## 2024-05-02 DIAGNOSIS — K5730 Diverticulosis of large intestine without perforation or abscess without bleeding: Secondary | ICD-10-CM | POA: Diagnosis not present

## 2024-05-02 DIAGNOSIS — Z1211 Encounter for screening for malignant neoplasm of colon: Secondary | ICD-10-CM | POA: Diagnosis not present

## 2024-05-02 DIAGNOSIS — D128 Benign neoplasm of rectum: Secondary | ICD-10-CM | POA: Diagnosis not present
